# Patient Record
Sex: Male | Born: 1970 | Race: White | Hispanic: No | Marital: Married | Smoking: Never smoker
Health system: Southern US, Community
[De-identification: ages and names within clinical notes are randomized; demographics above are authoritative.]

## PROBLEM LIST (undated history)

## (undated) DIAGNOSIS — Z789 Other specified health status: Secondary | ICD-10-CM

---

## 2014-03-02 ENCOUNTER — Emergency Department: Payer: Self-pay | Admitting: Emergency Medicine

## 2016-06-28 ENCOUNTER — Other Ambulatory Visit: Payer: Self-pay | Admitting: Internal Medicine

## 2016-06-28 DIAGNOSIS — R0602 Shortness of breath: Secondary | ICD-10-CM

## 2016-06-28 DIAGNOSIS — M25512 Pain in left shoulder: Secondary | ICD-10-CM

## 2016-06-29 ENCOUNTER — Ambulatory Visit
Admission: RE | Admit: 2016-06-29 | Discharge: 2016-06-29 | Disposition: A | Discharge status: Home / Self Care | Payer: Managed Care, Other (non HMO) | Source: Ambulatory Visit | Attending: Internal Medicine | Admitting: Internal Medicine

## 2016-06-29 DIAGNOSIS — M25512 Pain in left shoulder: Secondary | ICD-10-CM | POA: Insufficient documentation

## 2016-06-29 DIAGNOSIS — R0602 Shortness of breath: Secondary | ICD-10-CM | POA: Diagnosis not present

## 2016-07-04 ENCOUNTER — Other Ambulatory Visit: Payer: Self-pay | Admitting: Orthopedic Surgery

## 2016-07-04 DIAGNOSIS — M25512 Pain in left shoulder: Secondary | ICD-10-CM

## 2016-07-11 ENCOUNTER — Other Ambulatory Visit: Payer: Self-pay | Admitting: Internal Medicine

## 2016-07-11 DIAGNOSIS — E039 Hypothyroidism, unspecified: Secondary | ICD-10-CM

## 2016-07-13 ENCOUNTER — Encounter
Admission: RE | Admit: 2016-07-13 | Discharge: 2016-07-13 | Disposition: A | Discharge status: Home / Self Care | Payer: Managed Care, Other (non HMO) | Source: Ambulatory Visit | Attending: Internal Medicine | Admitting: Internal Medicine

## 2016-07-13 ENCOUNTER — Other Ambulatory Visit: Payer: Managed Care, Other (non HMO)

## 2016-07-13 ENCOUNTER — Encounter: Admission: RE | Admit: 2016-07-13 | Payer: Managed Care, Other (non HMO) | Source: Ambulatory Visit

## 2016-07-13 DIAGNOSIS — E039 Hypothyroidism, unspecified: Secondary | ICD-10-CM | POA: Diagnosis not present

## 2016-07-13 MED ORDER — SODIUM IODIDE I-123 7.4 MBQ CAPS
100.0000 | ORAL_CAPSULE | Freq: Once | ORAL | Status: AC
  Administered 2016-07-13: 133.675 via ORAL

## 2016-07-14 ENCOUNTER — Encounter
Admission: RE | Admit: 2016-07-14 | Discharge: 2016-07-14 | Disposition: A | Discharge status: Home / Self Care | Payer: Managed Care, Other (non HMO) | Source: Ambulatory Visit | Attending: Internal Medicine | Admitting: Internal Medicine

## 2016-07-14 ENCOUNTER — Ambulatory Visit
Admission: RE | Admit: 2016-07-14 | Discharge: 2016-07-14 | Disposition: A | Discharge status: Home / Self Care | Payer: Managed Care, Other (non HMO) | Source: Ambulatory Visit | Attending: Orthopedic Surgery | Admitting: Orthopedic Surgery

## 2016-07-14 ENCOUNTER — Other Ambulatory Visit: Payer: Managed Care, Other (non HMO)

## 2016-07-14 DIAGNOSIS — M25512 Pain in left shoulder: Secondary | ICD-10-CM

## 2016-07-14 DIAGNOSIS — E039 Hypothyroidism, unspecified: Secondary | ICD-10-CM | POA: Insufficient documentation

## 2016-07-14 DIAGNOSIS — M12812 Other specific arthropathies, not elsewhere classified, left shoulder: Secondary | ICD-10-CM | POA: Insufficient documentation

## 2016-07-14 DIAGNOSIS — M75102 Unspecified rotator cuff tear or rupture of left shoulder, not specified as traumatic: Secondary | ICD-10-CM | POA: Diagnosis not present

## 2016-07-14 MED ORDER — SODIUM CHLORIDE 0.9 % IJ SOLN
7.0000 mL | Freq: Once | INTRAMUSCULAR | Status: AC
  Administered 2016-07-14: 7 mL

## 2016-07-14 MED ORDER — GADOBENATE DIMEGLUMINE 529 MG/ML IV SOLN
0.1000 mL | Freq: Once | INTRAVENOUS | Status: AC
  Administered 2016-07-14: 0.1 mL via INTRA_ARTICULAR

## 2016-07-14 MED ORDER — LIDOCAINE HCL (PF) 1 % IJ SOLN
5.0000 mL | Freq: Once | INTRAMUSCULAR | Status: AC
  Administered 2016-07-14: 5 mL
  Filled 2016-07-14: qty 5

## 2016-07-14 MED ORDER — IOPAMIDOL (ISOVUE-300) INJECTION 61%
5.0000 mL | Freq: Once | INTRAVENOUS | Status: AC | PRN
  Administered 2016-07-14: 5 mL

## 2018-12-22 IMAGING — MR MR SHOULDER*L* W/ CM
6 of 7 series · 31 of 40 positions shown · IV contrast (agent unspecified)
Comparison: None.

CLINICAL DATA: Status post fall.  Left shoulder pain.

EXAM:
MR ARTHROGRAM OF THE LEFT SHOULDER
TECHNIQUE: Multiplanar, multisequence MR imaging of the left shoulder was
performed following the administration of intra-articular contrast.
CONTRAST:  See Injection Documentation.

[Series 3: T1 fat-sat · axial · 4.0mm · 0.47mm/px · z∈[-52,+58]mm · 5 of 26 slices shown (1 of 2)]
[im 1/26]
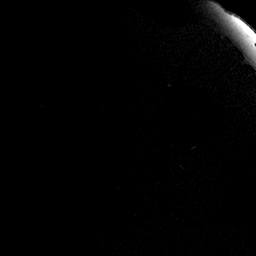
[im 7/26]
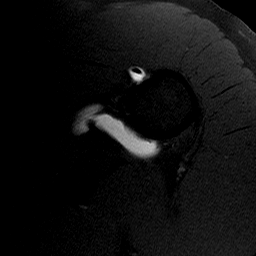
[im 13/26]
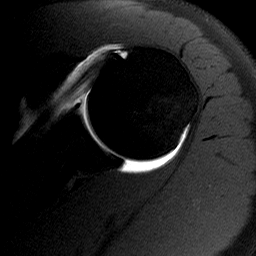
[im 19/26]
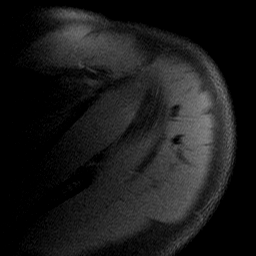
[im 26/26]
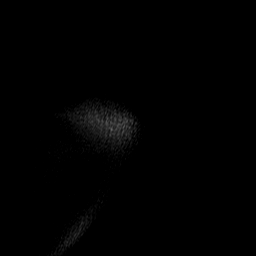

[Series 4: t1_coronal_fs · oblique · 4.0mm · 0.31mm/px · 3 of 25 slices shown]
[im 1/25]
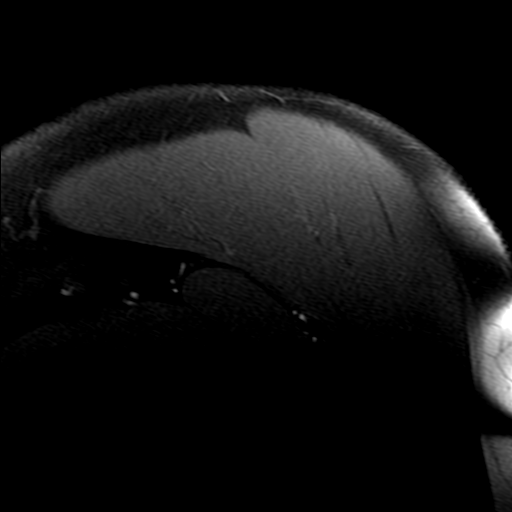
[im 5/25]
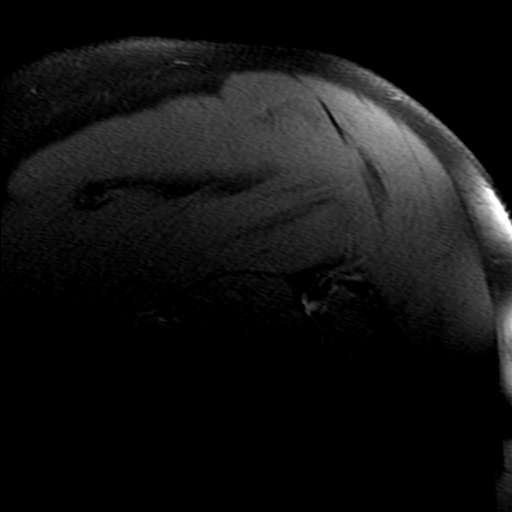
[im 10/25]
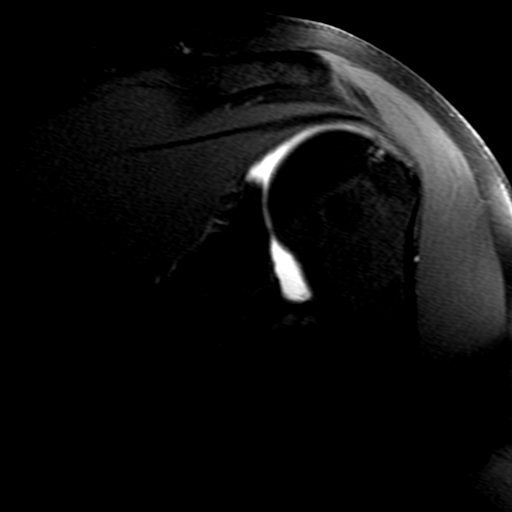

[Series 5: T2 fat-sat · oblique · 4.0mm · 0.62mm/px · 6 of 25 slices shown (1 of 2)]
[im 1/25]
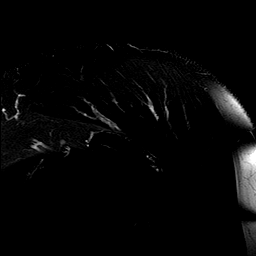
[im 5/25]
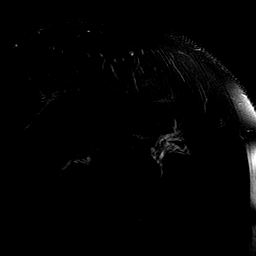
[im 10/25]
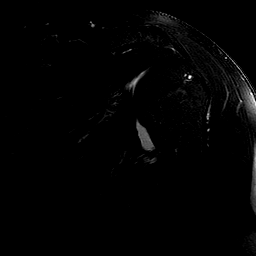
[im 15/25]
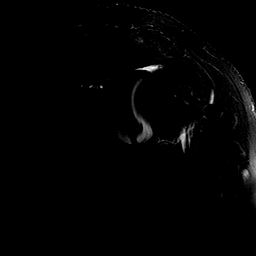
[im 20/25]
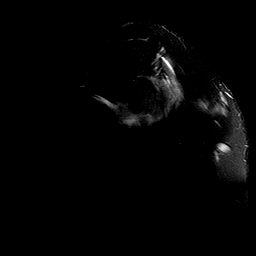
[im 25/25]
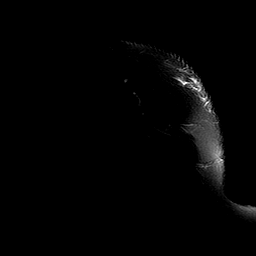

[Series 6: T1 · oblique · 4.0mm · 0.31mm/px · 6 of 25 slices shown]
[im 1/25]
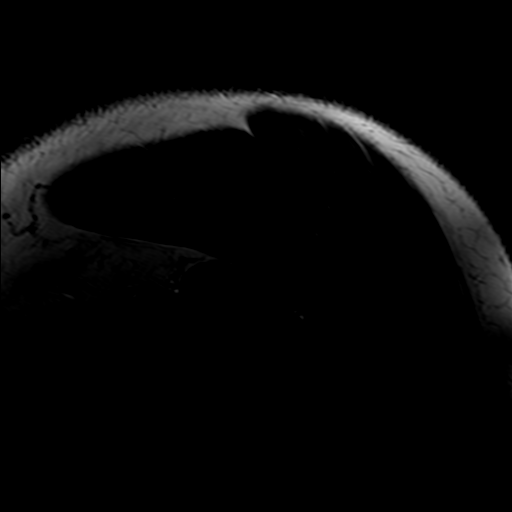
[im 5/25]
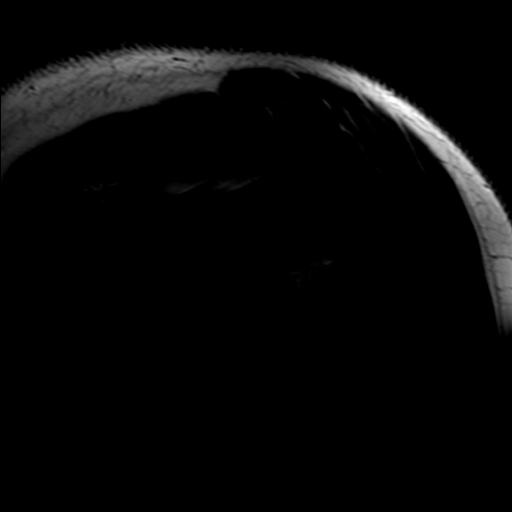
[im 10/25]
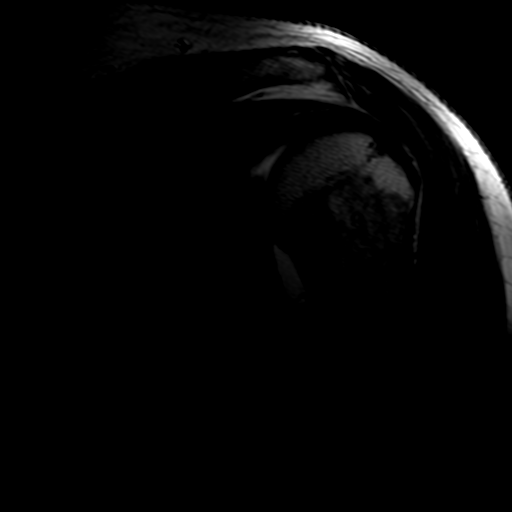
[im 15/25]
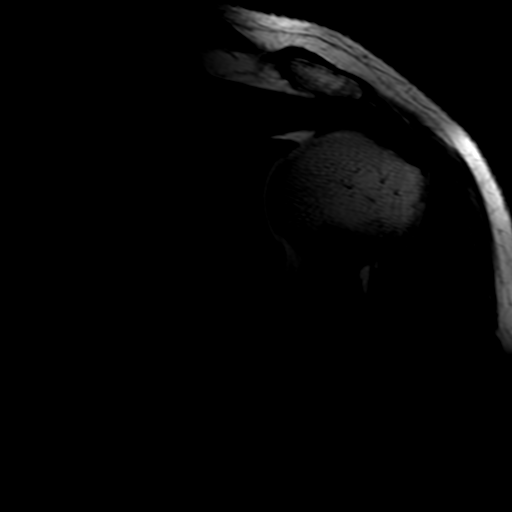
[im 20/25]
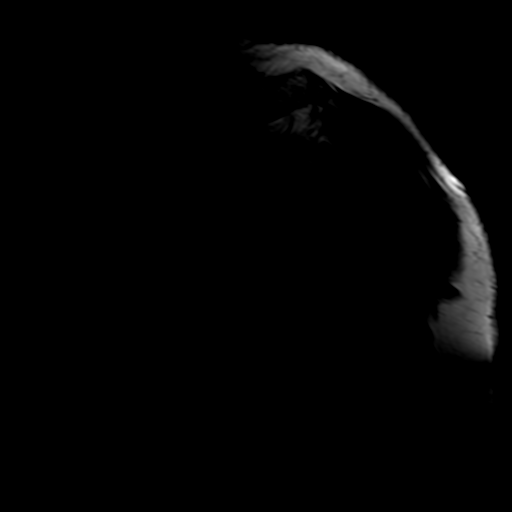
[im 25/25]
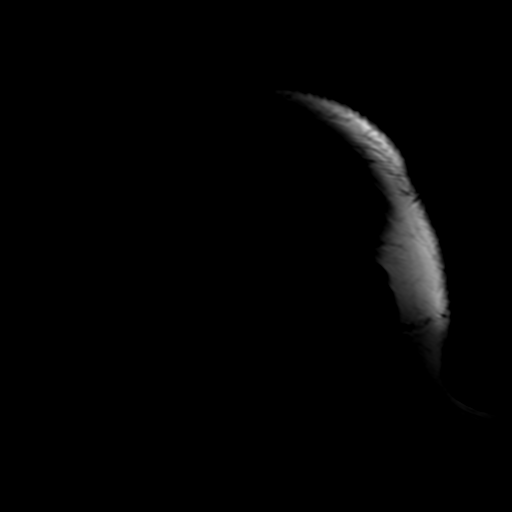

[Series 7: T2 fat-sat · oblique · 4.0mm · 0.62mm/px · 6 of 26 slices shown (2 of 2)]
[im 1/26]
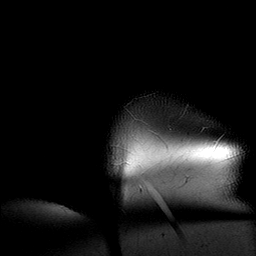
[im 6/26]
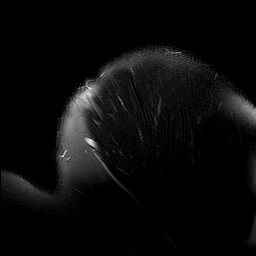
[im 11/26]
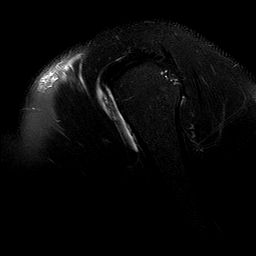
[im 16/26]
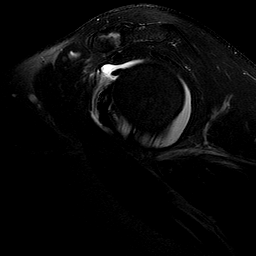
[im 21/26]
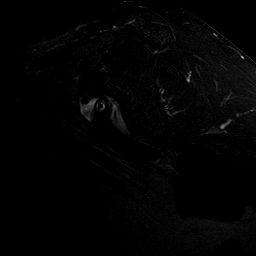
[im 26/26]
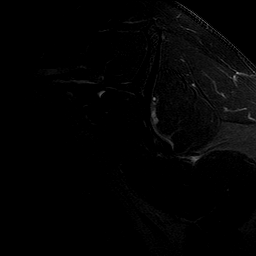

[Series 12: T1 fat-sat · sagittal · 4.0mm · 0.35mm/px · 5 of 23 slices shown (2 of 2)]
[im 1/23]
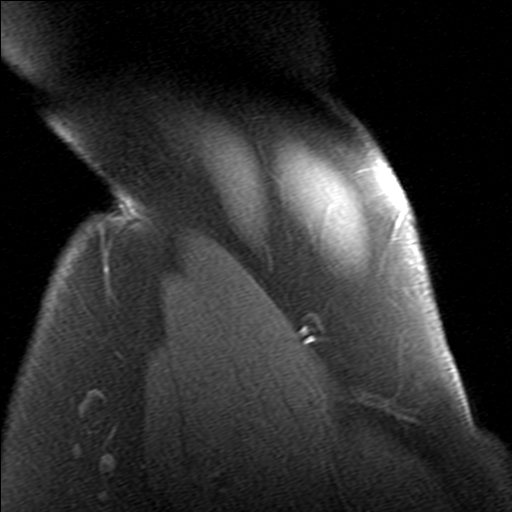
[im 6/23]
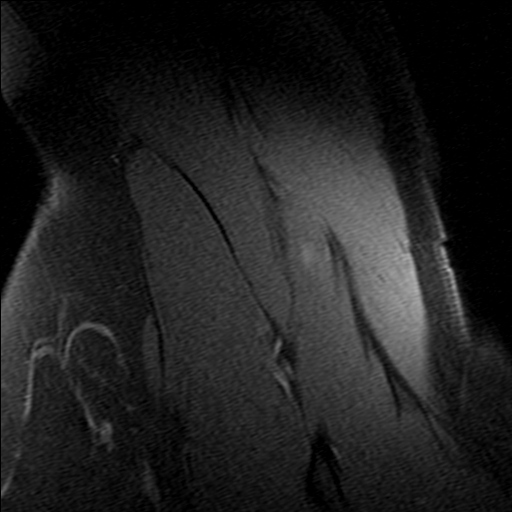
[im 12/23]
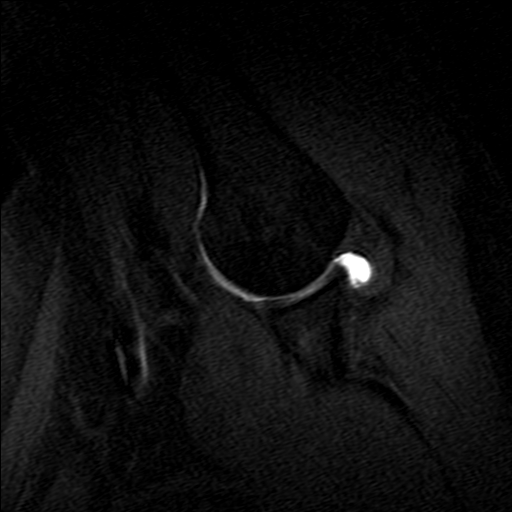
[im 17/23]
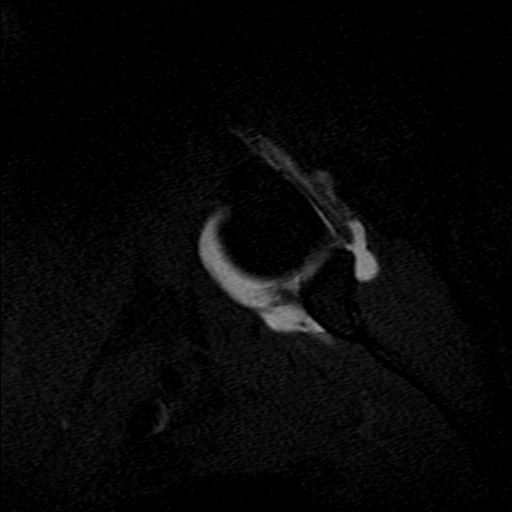
[im 23/23]
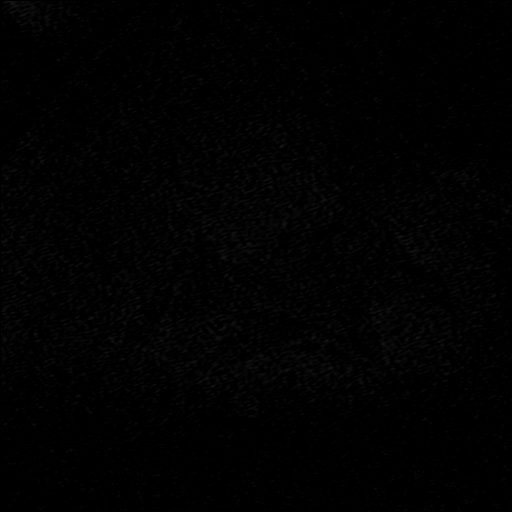

[31 of 40 positions shown; findings below may reference images not displayed]

FINDINGS: Rotator cuff: Mild tendinosis of the anterior supraspinatus tendon.
Mild tendinosis of the infraspinatus tendon without a tear. Teres
minor tendon is intact. Subscapularis tendon is intact.

Muscles: No atrophy or fatty replacement of nor abnormal signal
within, the muscles of the rotator cuff.

Biceps long head: Intact.

Acromioclavicular Joint: Mild arthropathy of the acromioclavicular
joint. No significant subacromial/subdeltoid bursal fluid. Type I
acromion.

Glenohumeral Joint: Intraarticular contrast distending the joint
capsule. No chondral defect. Normal glenohumeral ligaments.

Labrum: Intact.

Bones: No marrow signal abnormality.  No fracture or dislocation.
IMPRESSION: 1. Mild tendinosis of the anterior supraspinatus tendon.
2. Mild tendinosis of the infraspinatus tendon without a tear.
3. Mild arthropathy of the acromioclavicular joint.

## 2019-05-29 ENCOUNTER — Other Ambulatory Visit: Payer: Self-pay

## 2019-05-29 ENCOUNTER — Encounter: Payer: Self-pay | Admitting: Emergency Medicine

## 2019-05-29 ENCOUNTER — Emergency Department
Admission: EM | Admit: 2019-05-29 | Discharge: 2019-05-29 | Disposition: A | Discharge status: Home / Self Care | Payer: No Typology Code available for payment source | Attending: Emergency Medicine | Admitting: Emergency Medicine

## 2019-05-29 ENCOUNTER — Emergency Department: Payer: No Typology Code available for payment source

## 2019-05-29 DIAGNOSIS — Y939 Activity, unspecified: Secondary | ICD-10-CM | POA: Insufficient documentation

## 2019-05-29 DIAGNOSIS — S8001XA Contusion of right knee, initial encounter: Secondary | ICD-10-CM | POA: Insufficient documentation

## 2019-05-29 DIAGNOSIS — Y999 Unspecified external cause status: Secondary | ICD-10-CM | POA: Insufficient documentation

## 2019-05-29 DIAGNOSIS — S8991XA Unspecified injury of right lower leg, initial encounter: Secondary | ICD-10-CM | POA: Diagnosis present

## 2019-05-29 DIAGNOSIS — Y92410 Unspecified street and highway as the place of occurrence of the external cause: Secondary | ICD-10-CM | POA: Diagnosis not present

## 2019-05-29 NOTE — ED Notes (Addendum)
Staff unable to use pt's copy of the [facility] CoC form due to it being a physical copy and not the actual official document needed to process a workers comp claim.   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 Reather Laurence was called by this RN and given permission to use our [facility] COC form for pts needed workers comp,.
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
## 2019-05-29 NOTE — Discharge Instructions (Addendum)
Your exam and XR are essentially normal following your exam. Take OTC Tylenol and Motrin for pain relief. Apply ice to any sore muscles and your knee. Follow-up with [facility], this ED, or the medical provider approved by your employer.
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
## 2019-05-29 NOTE — ED Notes (Signed)
Pt ambulated to restroom independently to provide urine sample for WOC testing.
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
## 2022-07-28 ENCOUNTER — Ambulatory Visit: Payer: Self-pay | Admitting: General Surgery

## 2022-07-28 MED ORDER — CEFAZOLIN SODIUM-DEXTROSE 2-4 GM/100ML-% IV SOLN
2.0000 g | INTRAVENOUS | Status: AC
  Administered 2022-07-29: 3 g via INTRAVENOUS

## 2022-07-29 ENCOUNTER — Other Ambulatory Visit: Payer: Self-pay

## 2022-07-29 ENCOUNTER — Encounter
Admission: RE | Disposition: A | Discharge status: Home / Self Care | Payer: Self-pay | Source: Home / Self Care | Attending: General Surgery

## 2022-07-29 ENCOUNTER — Ambulatory Visit: Payer: BC Managed Care – PPO | Admitting: Anesthesiology

## 2022-07-29 ENCOUNTER — Encounter: Payer: Self-pay | Admitting: General Surgery

## 2022-07-29 ENCOUNTER — Ambulatory Visit
Admission: RE | Admit: 2022-07-29 | Discharge: 2022-07-29 | Disposition: A | Discharge status: Home / Self Care | Payer: BC Managed Care – PPO | Attending: General Surgery | Admitting: General Surgery

## 2022-07-29 DIAGNOSIS — E669 Obesity, unspecified: Secondary | ICD-10-CM | POA: Diagnosis not present

## 2022-07-29 DIAGNOSIS — L729 Follicular cyst of the skin and subcutaneous tissue, unspecified: Secondary | ICD-10-CM | POA: Insufficient documentation

## 2022-07-29 DIAGNOSIS — Z6836 Body mass index (BMI) 36.0-36.9, adult: Secondary | ICD-10-CM | POA: Insufficient documentation

## 2022-07-29 DIAGNOSIS — Z419 Encounter for procedure for purposes other than remedying health state, unspecified: Secondary | ICD-10-CM

## 2022-07-29 HISTORY — PX: CYST EXCISION: SHX5701

## 2022-07-29 HISTORY — DX: Other specified health status: Z78.9

## 2022-07-29 SURGERY — CYST REMOVAL
Anesthesia: General | Site: Neck | Wound class: Clean

## 2022-07-29 MED ORDER — ACETAMINOPHEN 325 MG PO TABS: 325.0000 mg | ORAL_TABLET | ORAL | Status: DC | PRN

## 2022-07-29 MED ORDER — CHLORHEXIDINE GLUCONATE 0.12 % MT SOLN: 15.0000 mL | Freq: Once | OROMUCOSAL | Status: AC

## 2022-07-29 MED ORDER — PHENYLEPHRINE 80 MCG/ML (10ML) SYRINGE FOR IV PUSH (FOR BLOOD PRESSURE SUPPORT)
PREFILLED_SYRINGE | INTRAVENOUS | Status: DC | PRN
  Administered 2022-07-29: 80 ug via INTRAVENOUS
  Administered 2022-07-29 (×2): 160 ug via INTRAVENOUS
  Administered 2022-07-29 (×2): 80 ug via INTRAVENOUS

## 2022-07-29 MED ORDER — ACETAMINOPHEN 160 MG/5ML PO SOLN: 325.0000 mg | ORAL | Status: DC | PRN

## 2022-07-29 MED ORDER — DEXAMETHASONE SODIUM PHOSPHATE 10 MG/ML IJ SOLN
INTRAMUSCULAR | Status: DC | PRN
  Administered 2022-07-29: 10 mg via INTRAVENOUS

## 2022-07-29 MED ORDER — LACTATED RINGERS IV SOLN: INTRAVENOUS | Status: DC

## 2022-07-29 MED ORDER — ONDANSETRON HCL 4 MG/2ML IJ SOLN
INTRAMUSCULAR | Status: DC | PRN
  Administered 2022-07-29: 4 mg via INTRAVENOUS

## 2022-07-29 MED ORDER — BUPIVACAINE-EPINEPHRINE 0.5% -1:200000 IJ SOLN
INTRAMUSCULAR | Status: DC | PRN
  Administered 2022-07-29: 30 mL

## 2022-07-29 MED ORDER — CHLORHEXIDINE GLUCONATE 0.12 % MT SOLN
OROMUCOSAL | Status: AC
  Administered 2022-07-29: 15 mL via OROMUCOSAL
  Filled 2022-07-29: qty 15

## 2022-07-29 MED ORDER — FENTANYL CITRATE (PF) 100 MCG/2ML IJ SOLN
INTRAMUSCULAR | Status: DC | PRN
  Administered 2022-07-29: 50 ug via INTRAVENOUS
  Administered 2022-07-29 (×2): 25 ug via INTRAVENOUS

## 2022-07-29 MED ORDER — 0.9 % SODIUM CHLORIDE (POUR BTL) OPTIME
TOPICAL | Status: DC | PRN
  Administered 2022-07-29: 200 mL

## 2022-07-29 MED ORDER — EPHEDRINE SULFATE (PRESSORS) 50 MG/ML IJ SOLN
INTRAMUSCULAR | Status: DC | PRN
  Administered 2022-07-29 (×2): 5 mg via INTRAVENOUS

## 2022-07-29 MED ORDER — MIDAZOLAM HCL 2 MG/2ML IJ SOLN
INTRAMUSCULAR | Status: AC
  Filled 2022-07-29: qty 2

## 2022-07-29 MED ORDER — PROPOFOL 10 MG/ML IV BOLUS
INTRAVENOUS | Status: DC | PRN
  Administered 2022-07-29: 200 mg via INTRAVENOUS

## 2022-07-29 MED ORDER — MIDAZOLAM HCL 2 MG/2ML IJ SOLN
INTRAMUSCULAR | Status: DC | PRN
  Administered 2022-07-29: 2 mg via INTRAVENOUS

## 2022-07-29 MED ORDER — ORAL CARE MOUTH RINSE: 15.0000 mL | Freq: Once | OROMUCOSAL | Status: AC

## 2022-07-29 MED ORDER — ACETAMINOPHEN 10 MG/ML IV SOLN
INTRAVENOUS | Status: AC
  Filled 2022-07-29: qty 100

## 2022-07-29 MED ORDER — EPINEPHRINE PF 1 MG/ML IJ SOLN
INTRAMUSCULAR | Status: AC
  Filled 2022-07-29: qty 1

## 2022-07-29 MED ORDER — HYDROCODONE-ACETAMINOPHEN 5-325 MG PO TABS: 1.0000 | ORAL_TABLET | ORAL | 0 refills | Status: AC | PRN

## 2022-07-29 MED ORDER — BUPIVACAINE HCL (PF) 0.5 % IJ SOLN
INTRAMUSCULAR | Status: AC
  Filled 2022-07-29: qty 30

## 2022-07-29 MED ORDER — SUGAMMADEX SODIUM 200 MG/2ML IV SOLN
INTRAVENOUS | Status: DC | PRN
  Administered 2022-07-29: 200 mg via INTRAVENOUS

## 2022-07-29 MED ORDER — ACETAMINOPHEN 10 MG/ML IV SOLN
INTRAVENOUS | Status: DC | PRN
  Administered 2022-07-29: 1000 mg via INTRAVENOUS

## 2022-07-29 MED ORDER — FENTANYL CITRATE (PF) 100 MCG/2ML IJ SOLN
INTRAMUSCULAR | Status: AC
  Filled 2022-07-29: qty 2

## 2022-07-29 MED ORDER — ONDANSETRON HCL 4 MG/2ML IJ SOLN: 4.0000 mg | Freq: Once | INTRAMUSCULAR | Status: DC | PRN

## 2022-07-29 MED ORDER — CEFAZOLIN SODIUM-DEXTROSE 2-4 GM/100ML-% IV SOLN
INTRAVENOUS | Status: AC
  Filled 2022-07-29: qty 100

## 2022-07-29 MED ORDER — DEXMEDETOMIDINE HCL IN NACL 80 MCG/20ML IV SOLN
INTRAVENOUS | Status: DC | PRN
  Administered 2022-07-29: 8 ug via BUCCAL

## 2022-07-29 MED ORDER — HYDROGEN PEROXIDE 3 % EX SOLN
CUTANEOUS | Status: DC | PRN
  Administered 2022-07-29: 1

## 2022-07-29 MED ORDER — HYDROCODONE-ACETAMINOPHEN 7.5-325 MG PO TABS: 1.0000 | ORAL_TABLET | Freq: Once | ORAL | Status: DC | PRN

## 2022-07-29 MED ORDER — FENTANYL CITRATE (PF) 100 MCG/2ML IJ SOLN: 25.0000 ug | INTRAMUSCULAR | Status: DC | PRN

## 2022-07-29 MED ORDER — ROCURONIUM BROMIDE 100 MG/10ML IV SOLN
INTRAVENOUS | Status: DC | PRN
  Administered 2022-07-29: 50 mg via INTRAVENOUS

## 2022-07-29 MED ORDER — LIDOCAINE HCL (CARDIAC) PF 100 MG/5ML IV SOSY
PREFILLED_SYRINGE | INTRAVENOUS | Status: DC | PRN
  Administered 2022-07-29: 100 mg via INTRAVENOUS

## 2022-07-29 SURGICAL SUPPLY — 23 items
DRAPE LAPAROTOMY 100X77 ABD (DRAPES) ×1 IMPLANT
ELECT CAUTERY BLADE 6.4 (BLADE) ×1 IMPLANT
ELECT REM PT RETURN 9FT ADLT (ELECTROSURGICAL) ×1
ELECTRODE REM PT RTRN 9FT ADLT (ELECTROSURGICAL) ×1 IMPLANT
GAUZE SPONGE 4X4 12PLY STRL (GAUZE/BANDAGES/DRESSINGS) IMPLANT
GLOVE BIO SURGEON STRL SZ 6.5 (GLOVE) ×1 IMPLANT
GLOVE BIOGEL PI IND STRL 6.5 (GLOVE) ×1 IMPLANT
GOWN STRL REUS W/ TWL LRG LVL3 (GOWN DISPOSABLE) ×2 IMPLANT
GOWN STRL REUS W/TWL LRG LVL3 (GOWN DISPOSABLE) ×2
KIT TURNOVER KIT A (KITS) ×1 IMPLANT
LABEL OR SOLS (LABEL) ×1 IMPLANT
MANIFOLD NEPTUNE II (INSTRUMENTS) ×1 IMPLANT
NDL HYPO 25X1 1.5 SAFETY (NEEDLE) ×1 IMPLANT
NEEDLE HYPO 25X1 1.5 SAFETY (NEEDLE) ×1 IMPLANT
NS IRRIG 500ML POUR BTL (IV SOLUTION) ×1 IMPLANT
PACK BASIN MINOR ARMC (MISCELLANEOUS) ×1 IMPLANT
SUT MNCRL 4-0 (SUTURE) ×1
SUT MNCRL 4-0 27XMFL (SUTURE) ×1
SUT VIC AB 3-0 SH 27 (SUTURE) ×1
SUT VIC AB 3-0 SH 27X BRD (SUTURE) ×1 IMPLANT
SUTURE MNCRL 4-0 27XMF (SUTURE) ×1 IMPLANT
SYR 10ML LL (SYRINGE) ×1 IMPLANT
TRAP FLUID SMOKE EVACUATOR (MISCELLANEOUS) ×1 IMPLANT

## 2022-07-29 NOTE — Anesthesia Postprocedure Evaluation (Signed)
Anesthesia Post Note  Patient: George Schneider  Procedure(s) Performed: CYST REMOVAL infected (Neck)  Patient location during evaluation: Phase II Anesthesia Type: General Level of consciousness: awake and alert Pain management: pain level controlled Vital Signs Assessment: post-procedure vital signs reviewed and stable Respiratory status: spontaneous breathing, nonlabored ventilation and respiratory function stable Cardiovascular status: blood pressure returned to baseline and stable Postop Assessment: no apparent nausea or vomiting Anesthetic complications: no  No notable events documented.   Last Vitals:  Vitals:   07/29/22 1400 07/29/22 1418  BP: 134/85 138/87  Pulse: 86 75  Resp: 14 15  Temp: (!) 36.3 C (!) 36.2 C  SpO2: 94% 98%    Last Pain:  Vitals:   07/29/22 1418  TempSrc: Temporal  PainSc: 0-No pain                 Alphonsus Sias

## 2022-07-29 NOTE — H&P (Signed)
PATIENT PROFILE: George Schneider is a 52 y.o. male who presents to the Clinic for consultation at the request of Dr. Ola Spurr for evaluation of epidermal inclusion cyst.  PCP: Angelena Form, MD  HISTORY OF PRESENT ILLNESS: [name] reports had been feeling a knot on his neck since a year ago. Last week it started to get infected and his girlfriend has tried to "squeeze" an abundant amount of foul-smelling fluid was drained. He endorses that he still continue having outer pain and recommendation of fluid. Pain localized to the neck. No pain radiation. Pain aggravated by applying pressure or touch. No alleviating factors. He was evaluated by PCP and started on doxycycline yesterday. No improvement. Denies any fever.  PROBLEM LIST: Problem List Date Reviewed: 07/27/2022   Noted  Class 2 obesity due to excess calories without serious comorbidity with body mass index (BMI) of 38.0 to 38.9 in adult 08/04/2021   GENERAL REVIEW OF SYSTEMS:   General ROS: negative for - chills, fatigue, fever, weight gain or weight loss Allergy and Immunology ROS: negative for - hives  Hematological and Lymphatic ROS: negative for - bleeding problems or bruising, negative for palpable nodes Endocrine ROS: negative for - heat or cold intolerance, hair changes Respiratory ROS: negative for - cough, shortness of breath or wheezing Cardiovascular ROS: no chest pain or palpitations GI ROS: negative for nausea, vomiting, abdominal pain, diarrhea, constipation Musculoskeletal ROS: negative for - joint swelling or muscle pain Neurological ROS: negative for - confusion, syncope Dermatological ROS: negative for pruritus and rash Psychiatric: negative for anxiety, depression, difficulty sleeping and memory loss  MEDICATIONS: Current Outpatient Medications  Medication Sig Dispense Refill  cholecalciferol (VITAMIN D3) 1000 unit tablet Take by mouth once daily  colchicine (COLCRYS) 0.6 mg tablet Take 2 tablets  (1.2mg ) by mouth at first sign of gout flare followed by 1 tablet (0.6mg ) after 1 hour. (Max 1.8mg  within 1 hour) 10 tablet 3  doxycycline (VIBRAMYCIN) 100 MG capsule Take 1 capsule (100 mg total) by mouth 2 (two) times daily for 10 days 20 capsule 0  multivitamin tablet Take 1 tablet by mouth once daily  liraglutide, weight loss, (SAXENDA) 3 mg/0.5 mL (18 mg/3 mL) pen injector Inject 0.6 mg subcutaneously once daily for 7 days, THEN 1.2 mg once daily for 7 days, THEN 1.8 mg once daily for 7 days, THEN 2.4 mg once daily for 7 days, THEN 3 mg once daily for 7 days. 10.5 mL 0   No current facility-administered medications for this visit.   ALLERGIES: Patient has no known allergies.  PAST MEDICAL HISTORY: History reviewed. No pertinent past medical history.  PAST SURGICAL HISTORY: History reviewed. No pertinent surgical history.   FAMILY HISTORY: Family History  Problem Relation Age of Onset  Alzheimer's disease Mother  Myocardial Infarction (Heart attack) Father  Psoriasis Other    SOCIAL HISTORY: Social History   Socioeconomic History  Marital status: Legally Separated  Tobacco Use  Smoking status: Some Days  Types: Cigarettes  Vaping Use  Vaping Use: Never used  Substance and Sexual Activity  Alcohol use: Yes  Alcohol/week: 32.0 standard drinks of alcohol  Types: 12 Cans of beer, 20 Shots of liquor per week  Drug use: Never  Sexual activity: Yes  Partners: Female  Birth control/protection: None   PHYSICAL EXAM: Vitals:  07/28/22 1508  BP: 135/86  Pulse: 78   Body mass index is 36.35 kg/m. Weight: (!) 121.6 kg (268 lb)   GENERAL: Alert, active, oriented x3  HEENT:  Pupils equal reactive to light. Extraocular movements are intact. Sclera clear. Palpebral conjunctiva normal red color.Pharynx clear.  NECK: Supple with no adenopathy. There is a large posterior neck infected cyst. Surrounding erythema.  LUNGS: Sound clear with no rales rhonchi or wheezes.  HEART:  Regular rhythm S1 and S2 without murmur.  ABDOMEN: Soft and depressible, nontender with no palpable mass, no hepatomegaly. Wounds dry and clean.  EXTREMITIES: Well-developed well-nourished symmetrical with no dependent edema.  NEUROLOGICAL: Awake alert oriented, facial expression symmetrical, moving all extremities.  REVIEW OF DATA: I have reviewed the following data today: No visits with results within 3 Month(s) from this visit.  Latest known visit with results is:  Initial consult on 08/04/2021  Component Date Value  WBC (White Blood Cell Co* 08/04/2021 8.0  RBC (Red Blood Cell Coun* 08/04/2021 5.00  Hemoglobin 08/04/2021 15.2  Hematocrit 08/04/2021 44.6  MCV (Mean Corpuscular Vo* 08/04/2021 89.2  MCH (Mean Corpuscular He* 08/04/2021 30.4  MCHC (Mean Corpuscular H* 08/04/2021 34.1  Platelet Count 08/04/2021 269  RDW-CV (Red Cell Distrib* 08/04/2021 12.1  MPV (Mean Platelet Volum* 08/04/2021 10.0  Neutrophils 08/04/2021 3.90  Lymphocytes 08/04/2021 2.94  Monocytes 08/04/2021 0.79  Eosinophils 08/04/2021 0.29  Basophils 08/04/2021 0.06  Neutrophil % 08/04/2021 48.7  Lymphocyte % 08/04/2021 36.7  Monocyte % 08/04/2021 9.9  Eosinophil % 08/04/2021 3.6  Basophil% 08/04/2021 0.7  Immature Granulocyte % 08/04/2021 0.4  Immature Granulocyte Cou* 08/04/2021 0.03  Glucose 08/04/2021 73  Sodium 08/04/2021 139  Potassium 08/04/2021 4.3  Chloride 08/04/2021 105  Carbon Dioxide (CO2) 08/04/2021 26.4  Urea Nitrogen (BUN) 08/04/2021 15  Creatinine 08/04/2021 1.0  Glomerular Filtration Ra* 08/04/2021 79  Calcium 08/04/2021 9.7  AST 08/04/2021 27  ALT 08/04/2021 26  Alk Phos (alkaline Phosp* 08/04/2021 68  Albumin 08/04/2021 4.1  Bilirubin, Total 08/04/2021 0.5  Protein, Total 08/04/2021 7.5  A/G Ratio 08/04/2021 1.2  Cholesterol, Total 08/04/2021 194  Triglyceride 08/04/2021 131  HDL (High Density Lipopr* 08/04/2021 41.5  LDL Calculated 08/04/2021 126  VLDL Cholesterol  08/04/2021 26  Cholesterol/HDL Ratio 08/04/2021 4.7  Thyroid Stimulating Horm* 08/04/2021 1.420  Hep C Virus Ab - [facility] 08/04/2021 Non Reactive  Cologuard 05/04/2022 Negative    ASSESSMENT: Mr. [name] is a 52 y.o. male presenting for consultation for infected epidermal inclusion cyst. The patient has a mass on the posterior neck that is causing some pain and discomfort on pressure due to current infection. Patient oriented about the diagnosis of infected epidermal inclusion cyst, since it had not resolved with local drainage and antibiotic therapy I discussed with patient to do formal excisional cleansing of the infected area in the operating room under sedation. Discussed with patient risks of bleeding, infection, scarring, pain. The patient reported he understood and agreed to proceed.  Infected epidermoid cyst [L72.0, L08.9]  PLAN: Excision of infected epidermoid cyst of the neck EO:[id number]) Continue antibiotic therapy as prescribed by Dr. Ola Spurr  Patient verbalized understanding, all questions were answered, and were agreeable with the plan outlined above.   Herbert Pun, MD

## 2022-07-29 NOTE — Anesthesia Preprocedure Evaluation (Signed)
Anesthesia Evaluation  Patient identified by MRN, date of birth, ID band Patient awake    Reviewed: Allergy & Precautions, NPO status , Patient's Chart, lab work & pertinent test results  Airway Mallampati: II  TM Distance: >3 FB Neck ROM: full    Dental  (+) Chipped   Pulmonary neg pulmonary ROS   Pulmonary exam normal        Cardiovascular negative cardio ROS Normal cardiovascular exam     Neuro/Psych negative neurological ROS  negative psych ROS   GI/Hepatic negative GI ROS, Neg liver ROS,,,  Endo/Other  negative endocrine ROS  Mod obesity  Renal/GU      Musculoskeletal   Abdominal   Peds  Hematology negative hematology ROS (+)   Anesthesia Other Findings Past Medical History: No date: Medical history non-contributory  History reviewed. No pertinent surgical history.  BMI    Body Mass Index: 36.40 kg/m      Reproductive/Obstetrics negative OB ROS                             Anesthesia Physical Anesthesia Plan  ASA: 2  Anesthesia Plan: General ETT   Post-op Pain Management:    Induction: Intravenous  PONV Risk Score and Plan: Ondansetron, Dexamethasone, Midazolam and Treatment may vary due to age or medical condition  Airway Management Planned: Oral ETT  Additional Equipment:   Intra-op Plan:   Post-operative Plan: Extubation in OR  Informed Consent: I have reviewed the patients History and Physical, chart, labs and discussed the procedure including the risks, benefits and alternatives for the proposed anesthesia with the patient or authorized representative who has indicated his/her understanding and acceptance.     Dental Advisory Given  Plan Discussed with: Anesthesiologist, CRNA and Surgeon  Anesthesia Plan Comments: (Patient consented for risks of anesthesia including but not limited to:  - adverse reactions to medications - damage to eyes, teeth, lips  or other oral mucosa - nerve damage due to positioning  - sore throat or hoarseness - Damage to heart, brain, nerves, lungs, other parts of body or loss of life  Patient voiced understanding.)       Anesthesia Quick Evaluation

## 2022-07-29 NOTE — Discharge Instructions (Addendum)
  Diet: Resume home heart healthy regular diet.   Activity: Increase activity as tolerated. Light activity and walking are encouraged. Do not drive or drink alcohol if taking narcotic pain medications.  Wound care: Remove dressing tomorrow. Once dressing removed, may shower with soapy water and pat dry (do not rub incisions), but no baths or submerging incision underwater until follow-up. (no swimming)   I recommend to pack the wound on the edge with the small opening with a simple plain gauze. That will help the wound to keep draining and not getting infected again.   Medications: Resume all home medications. For mild to moderate pain: acetaminophen (Tylenol) or ibuprofen (if no kidney disease). Combining Tylenol with alcohol can substantially increase your risk of causing liver disease. Narcotic pain medications, if prescribed, can be used for severe pain, though may cause nausea, constipation, and drowsiness. Do not combine Tylenol and Norco within a 6 hour period as Norco contains Tylenol. If you do not need the narcotic pain medication, you do not need to fill the prescription.  Call office [phone]) at any time if any questions, worsening pain, fevers/chills, bleeding, drainage from incision site, or other concerns.   AMBULATORY SURGERY  DISCHARGE INSTRUCTIONS   The drugs that you were given will stay in your system until tomorrow so for the next 24 hours you should not:  Drive an automobile Make any legal decisions Drink any alcoholic beverage   You may resume regular meals tomorrow.  Today it is better to start with liquids and gradually work up to solid foods.  You may eat anything you prefer, but it is better to start with liquids, then soup and crackers, and gradually work up to solid foods.   Please notify your doctor immediately if you have any unusual bleeding, trouble breathing, redness and pain at the surgery site, drainage, fever, or pain not relieved by  medication.    Your post-operative visit with Dr.                                       is: Date:                        Time:    Please call to schedule your post-operative visit.  Additional Instructions:

## 2022-07-29 NOTE — Op Note (Signed)
OPERATION REPORT  Pre Operative Diagnosis: Infected epidermoid cyst of neck  Post operative diagnosis: Infected epidermoid cyst of neck  Anesthesia: General and Local   Surgeon: Dr. Windell Moment  Procedure: Excision of cyst   Indication: This 52 y.o. year old male with a soft tissue mass that is causing infection and pain   Description of procedure: after orienting patient about the procedure steps and benefits and patient agreed to proceed. Time out was done identifying correct patient and location of procedure. After induction of general anesthesia, local anesthesia was infiltrated around the palpable lesion. With a blade #15, an elliptical incision was made using the skin lines. Sharp dissection was carried down and lesion was excised including dermal tissue. The mass measured 6 cm. Skin partially closed with two interrupted sutures but left area to drain. Specimen sent to pathology.    Complications: none   EBL: minimal  Herbert Pun, MD, FACS

## 2022-07-29 NOTE — Transfer of Care (Signed)
Immediate Anesthesia Transfer of Care Note  Patient: George Schneider  Procedure(s) Performed: CYST REMOVAL infected (Neck)  Patient Location: PACU  Anesthesia Type:General  Level of Consciousness: awake, alert , and oriented  Airway & Oxygen Therapy: Patient Spontanous Breathing and Patient connected to face mask oxygen  Post-op Assessment: Report given to RN and Post -op Vital signs reviewed and stable  Post vital signs: Reviewed and stable  Last Vitals:  Vitals Value Taken Time  BP 152/86 07/29/22 1334  Temp    Pulse 90 07/29/22 1336  Resp 16 07/29/22 1336  SpO2 100 % 07/29/22 1336  Vitals shown include unvalidated device data.  Last Pain:  Vitals:   07/29/22 0949  TempSrc: Temporal  PainSc: 0-No pain         Complications: No notable events documented.

## 2022-07-29 NOTE — Anesthesia Procedure Notes (Signed)
Procedure Name: Intubation Date/Time: 07/29/2022 12:47 PM  Performed by: Lily Peer, Siena Poehler, CRNAPre-anesthesia Checklist: Patient identified, Emergency Drugs available, Suction available and Patient being monitored Patient Re-evaluated:Patient Re-evaluated prior to induction Oxygen Delivery Method: Circle system utilized Preoxygenation: Pre-oxygenation with 100% oxygen Induction Type: IV induction Ventilation: Mask ventilation with difficulty, Two handed mask ventilation required and Oral airway inserted - appropriate to patient size Laryngoscope Size: McGraph and 3 Grade View: Grade I Tube type: Oral Tube size: 7.0 mm Number of attempts: 1 Airway Equipment and Method: Stylet and Oral airway Placement Confirmation: ETT inserted through vocal cords under direct vision, positive ETCO2 and breath sounds checked- equal and bilateral Secured at: 22 cm Tube secured with: Tape Dental Injury: Teeth and Oropharynx as per pre-operative assessment

## 2022-07-30 ENCOUNTER — Encounter: Payer: Self-pay | Admitting: General Surgery

## 2022-08-01 LAB — SURGICAL PATHOLOGY
